# Patient Record
Sex: Male | Born: 1955 | Race: White | Hispanic: No | Marital: Married | Smoking: Never smoker
Health system: Southern US, Community
[De-identification: ages and names within clinical notes are randomized; demographics above are authoritative.]

---

## 2011-06-22 ENCOUNTER — Inpatient Hospital Stay: Payer: Self-pay | Admitting: *Deleted

## 2012-12-19 IMAGING — CT CT HEAD WITHOUT CONTRAST
2 series · 16 of 30 positions shown, 20 images · non-contrast
Comparison: none

REASON FOR EXAM: syncope
COMMENTS:

[Series 2: without · axial · non-contrast · 0.46mm/px · z∈[+328,+453]mm · 13 of 31 slices shown, 17 images]
[im 3/31  brain]
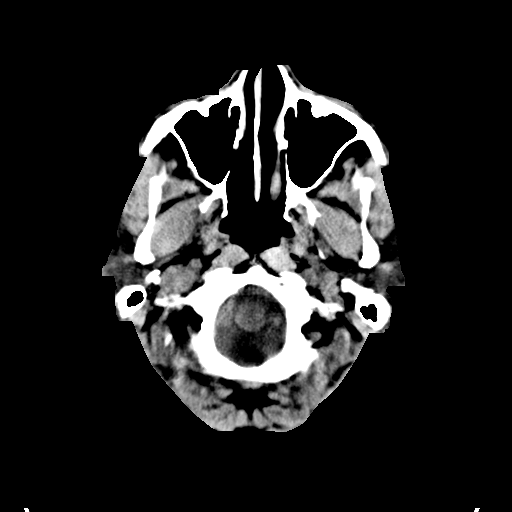
[im 3/31  bone]
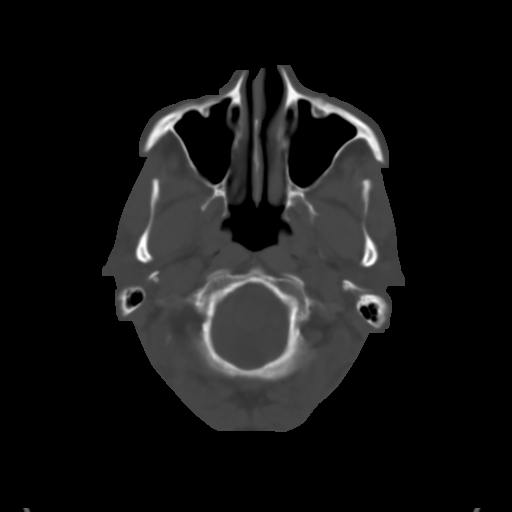
[im 5/31  brain]
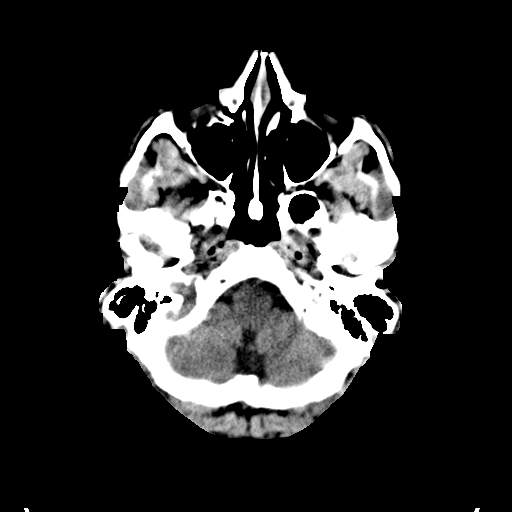
[im 7/31  brain]
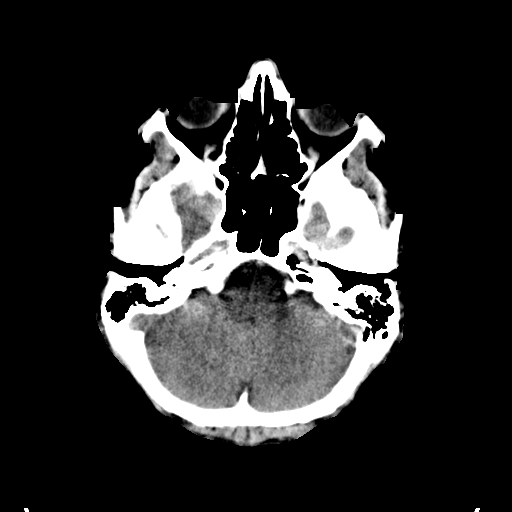
[im 9/31  brain]
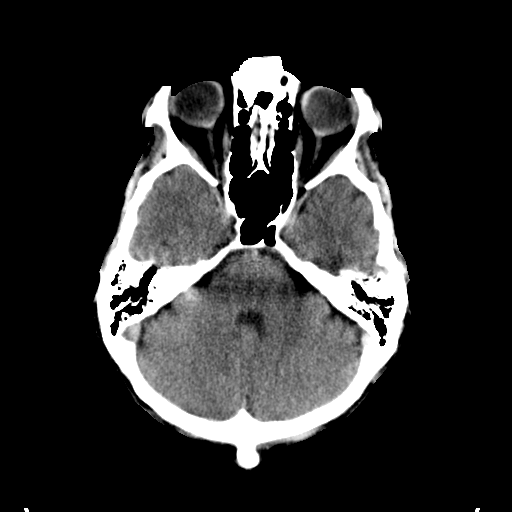
[im 11/31  brain]
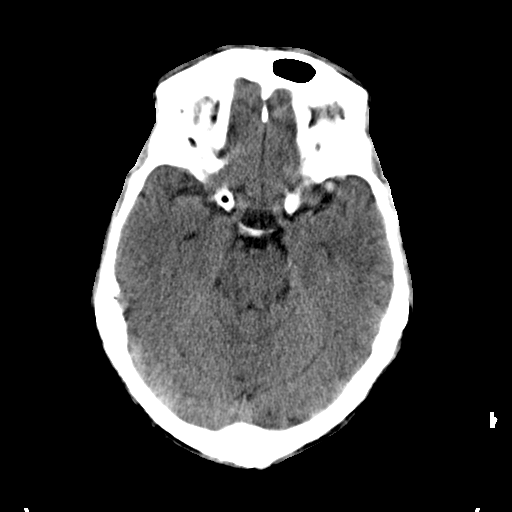
[im 11/31  bone]
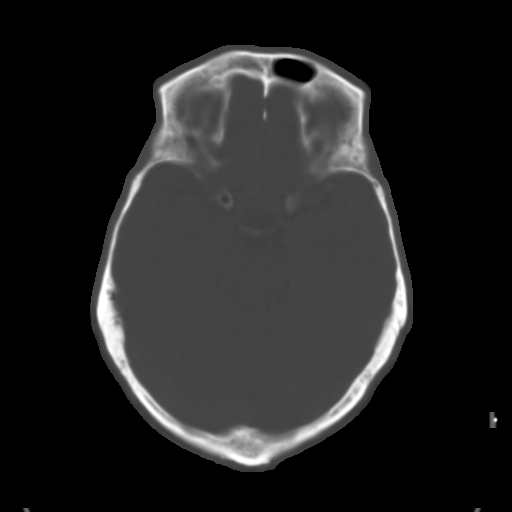
[im 13/31  brain]
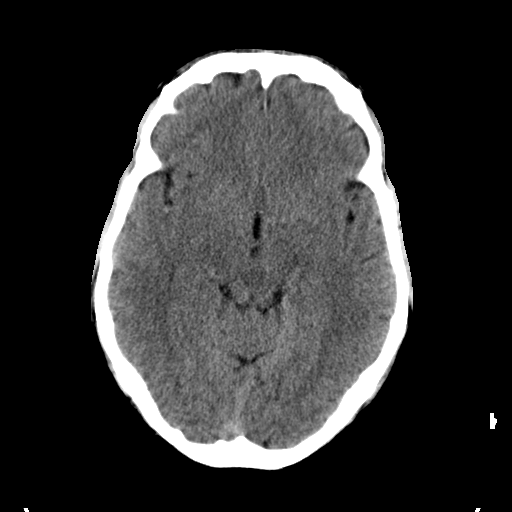
[im 16/31  brain]
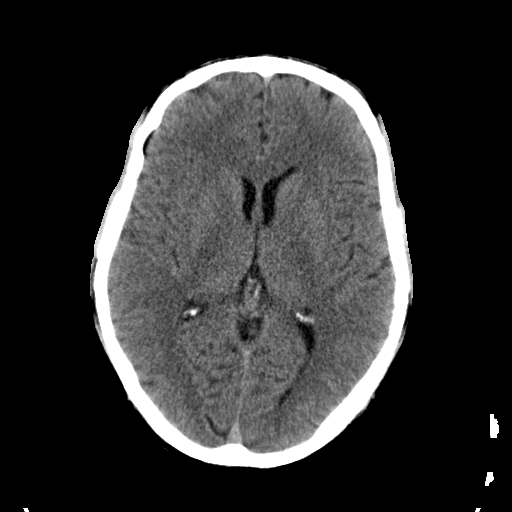
[im 18/31  brain]
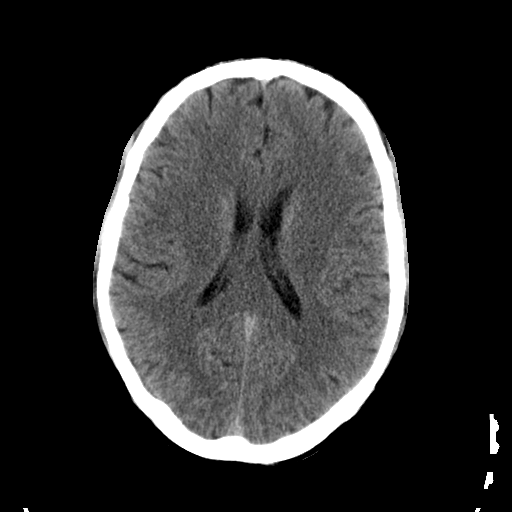
[im 20/31  brain]
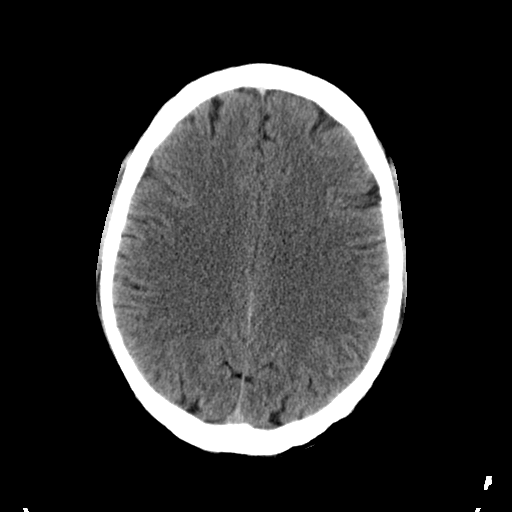
[im 20/31  bone]
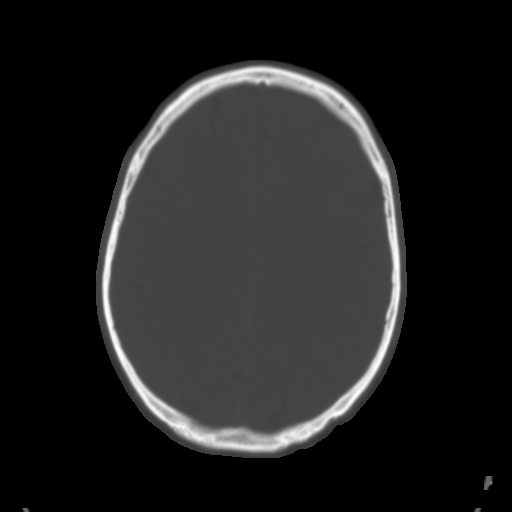
[im 22/31  brain]
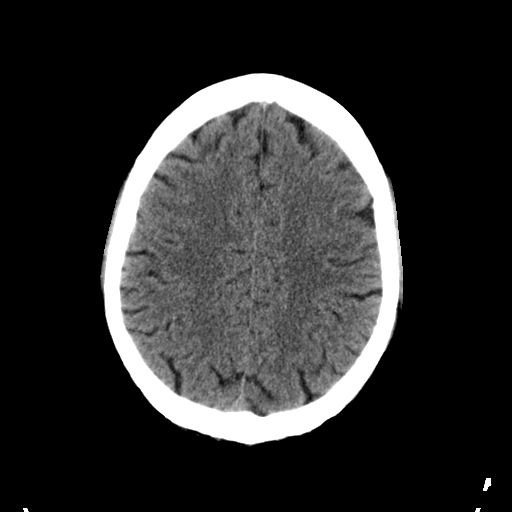
[im 24/31  brain]
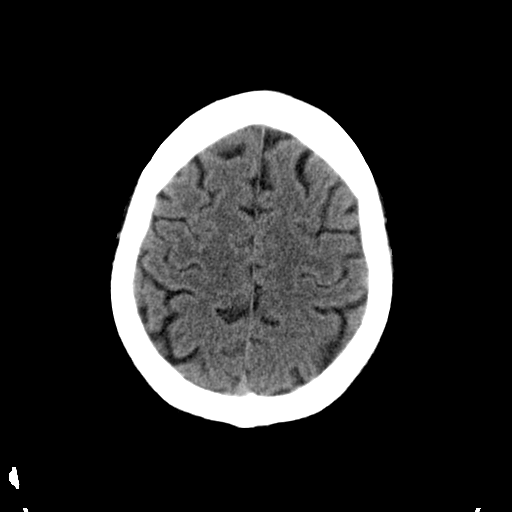
[im 26/31  brain]
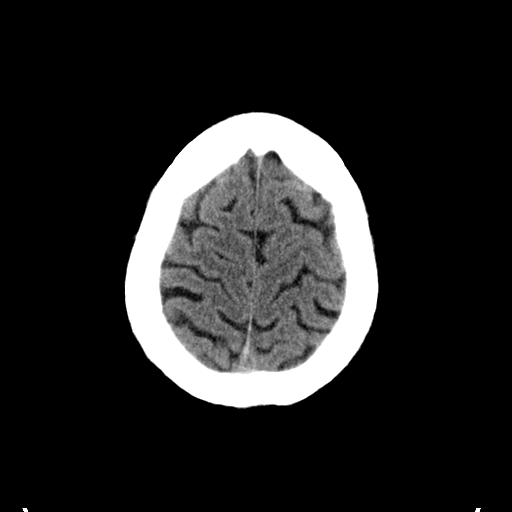
[im 28/31  brain]
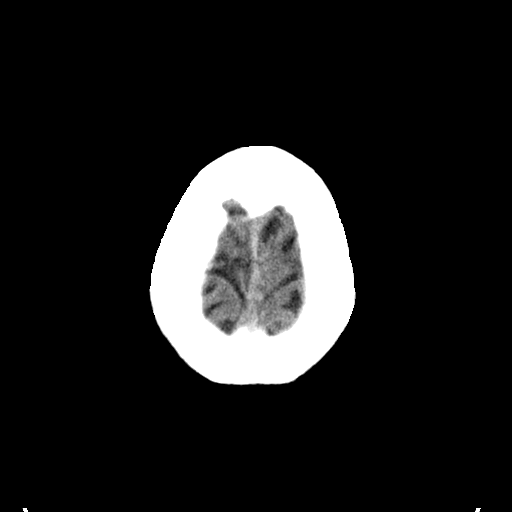
[im 28/31  bone]
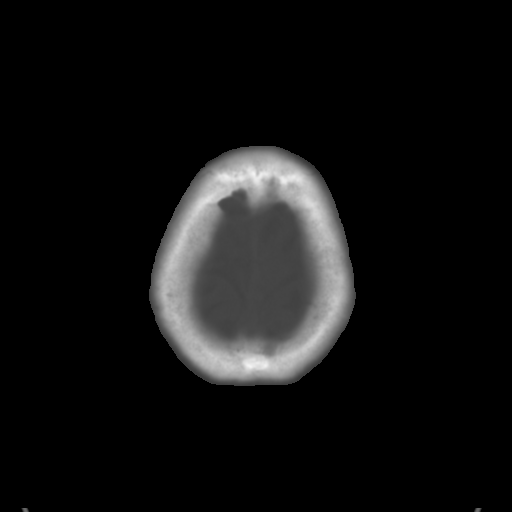

[Series 3: bone · axial · 0.46mm/px · z∈[+328,+368]mm · 3 of 31 slices shown]
[im 3/31  bone]
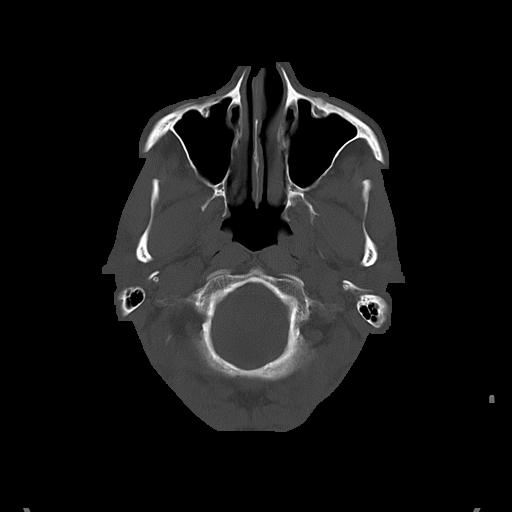
[im 7/31  bone]
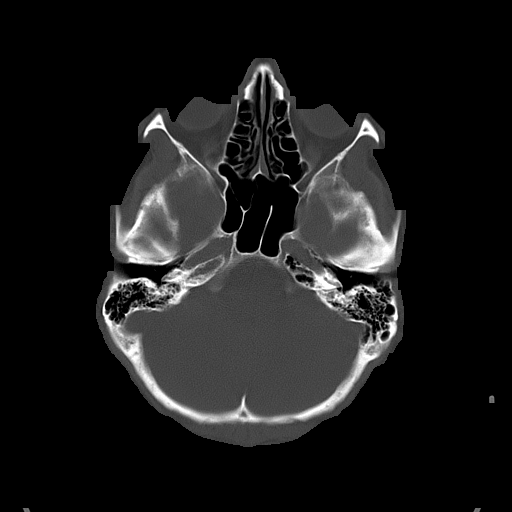
[im 11/31  bone]
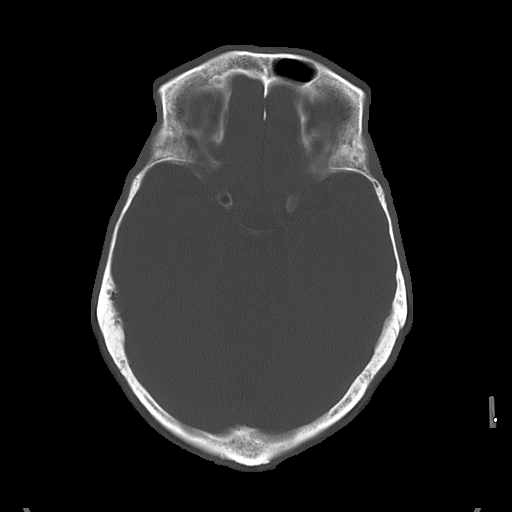

[16 of 30 positions shown; findings below may reference images not displayed]

PROCEDURE:     CT  - CT HEAD WITHOUT CONTRAST  - June 21, 2011 [DATE]

RESULT:     Emergent noncontrast CT of the brain is performed. The patient
has no previous exam for comparison.

The ventricles and sulci are normal. There is no hemorrhage. There is no
focal mass, mass-effect or midline shift. There is no evidence of edema or
territorial infarct. The bone windows demonstrate normal aeration of the
paranasal sinuses and mastoid air cells. There is no skull fracture
demonstrated.
IMPRESSION: 1. No acute intracranial abnormality.

## 2017-09-30 ENCOUNTER — Emergency Department
Admission: EM | Admit: 2017-09-30 | Discharge: 2017-09-30 | Disposition: A | Discharge status: Home / Self Care | Payer: Self-pay | Attending: Emergency Medicine | Admitting: Emergency Medicine

## 2017-09-30 ENCOUNTER — Emergency Department: Payer: Self-pay

## 2017-09-30 ENCOUNTER — Other Ambulatory Visit: Payer: Self-pay

## 2017-09-30 DIAGNOSIS — Y929 Unspecified place or not applicable: Secondary | ICD-10-CM | POA: Insufficient documentation

## 2017-09-30 DIAGNOSIS — W274XXA Contact with kitchen utensil, initial encounter: Secondary | ICD-10-CM | POA: Insufficient documentation

## 2017-09-30 DIAGNOSIS — Z23 Encounter for immunization: Secondary | ICD-10-CM | POA: Insufficient documentation

## 2017-09-30 DIAGNOSIS — Y9389 Activity, other specified: Secondary | ICD-10-CM | POA: Insufficient documentation

## 2017-09-30 DIAGNOSIS — Y998 Other external cause status: Secondary | ICD-10-CM | POA: Insufficient documentation

## 2017-09-30 DIAGNOSIS — S61412A Laceration without foreign body of left hand, initial encounter: Secondary | ICD-10-CM | POA: Insufficient documentation

## 2017-09-30 MED ORDER — OXYCODONE-ACETAMINOPHEN 5-325 MG PO TABS
1.0000 | ORAL_TABLET | Freq: Once | ORAL | Status: AC
  Administered 2017-09-30: 1 via ORAL
  Filled 2017-09-30: qty 1

## 2017-09-30 MED ORDER — SULFAMETHOXAZOLE-TRIMETHOPRIM 800-160 MG PO TABS: 1.0000 | ORAL_TABLET | Freq: Two times a day (BID) | ORAL | 0 refills | Status: AC

## 2017-09-30 MED ORDER — LIDOCAINE HCL (PF) 1 % IJ SOLN
5.0000 mL | Freq: Once | INTRAMUSCULAR | Status: AC
  Administered 2017-09-30: 5 mL
  Filled 2017-09-30: qty 5

## 2017-09-30 MED ORDER — ONDANSETRON 4 MG PO TBDP
ORAL_TABLET | ORAL | Status: AC
  Filled 2017-09-30: qty 1

## 2017-09-30 MED ORDER — LIDOCAINE HCL 1 % IJ SOLN
5.0000 mL | Freq: Once | INTRAMUSCULAR | Status: AC
  Administered 2017-09-30: 5 mL

## 2017-09-30 MED ORDER — ONDANSETRON 4 MG PO TBDP
4.0000 mg | ORAL_TABLET | Freq: Once | ORAL | Status: AC
  Administered 2017-09-30: 4 mg via ORAL

## 2017-09-30 MED ORDER — TETANUS-DIPHTH-ACELL PERTUSSIS 5-2.5-18.5 LF-MCG/0.5 IM SUSP
0.5000 mL | Freq: Once | INTRAMUSCULAR | Status: AC
  Administered 2017-09-30: 0.5 mL via INTRAMUSCULAR
  Filled 2017-09-30: qty 0.5

## 2017-09-30 MED ORDER — LIDOCAINE HCL (PF) 1 % IJ SOLN
INTRAMUSCULAR | Status: AC
  Filled 2017-09-30: qty 10

## 2017-09-30 NOTE — ED Provider Notes (Signed)
[facility] Emergency Department Provider Note  ____________________________________________  Time seen: Approximately 7:12 PM  I have reviewed the triage vital signs and the nursing notes.   HISTORY  Chief Complaint Laceration   HPI Alex Campbell is a 62 y.o. male presents to the emergency department with a 4 cm left dorsal hand laceration after Alex Campbell caught his hand in a meat grinder.  Alex Campbell denies weakness, radiculopathy or changes in sensation of the upper extremities.  Alex Campbell's tetanus status is not updated.   No past medical history on file.  There are no active problems to display for this Alex Campbell.     Prior to Admission medications   Medication Sig Start Date End Date Taking? Authorizing Provider  sulfamethoxazole-trimethoprim (BACTRIM DS,SEPTRA DS) 800-160 MG tablet Take 1 tablet by mouth 2 (two) times daily for 7 days. 09/30/17 10/07/17  Orvil FeilWoods, Eliah Marquard M, PA-C    Allergies Bee venom  No family history on file.  Social History Social History   Tobacco Use  . Smoking status: Not on file  Substance Use Topics  . Alcohol use: Not on file  . Drug use: Not on file     Review of Systems  Constitutional: No fever/chills Eyes: No visual changes. No discharge ENT: No upper respiratory complaints. Cardiovascular: no chest pain. Respiratory: no cough. No SOB. Gastrointestinal: No abdominal pain.  No nausea, no vomiting.  No diarrhea.  No constipation. Musculoskeletal: Alex Campbell has left hand pain.  Skin: Alex Campbell has left hand laceration.  Neurological: Negative for headaches, focal weakness or numbness.   ____________________________________________   PHYSICAL EXAM:  VITAL SIGNS: ED Triage Vitals  Enc Vitals Group     BP 09/30/17 1626 (!) 161/99     Pulse Rate 09/30/17 1626 77     Resp 09/30/17 1626 (!) 24     Temp 09/30/17 1626 (!) 97.5 F (36.4 C)     Temp Source 09/30/17 1626 Oral     SpO2 09/30/17 1626 99 %     Weight  09/30/17 1627 138 lb (62.6 kg)     Height 09/30/17 1627 5\' 11"  (1.803 m)     Head Circumference --      Peak Flow --      Pain Score 09/30/17 1627 10     Pain Loc --      Pain Edu? --      Excl. in [place]? --      Constitutional: Alert and oriented. Well appearing and in no acute distress. Eyes: Conjunctivae are normal. PERRL. EOMI. Head: Atraumatic. Cardiovascular: Normal rate, regular rhythm. Normal S1 and S2.  Good peripheral circulation. Respiratory: Normal respiratory effort without tachypnea or retractions. Lungs CTAB. Good air entry to the bases with no decreased or absent breath sounds. Musculoskeletal: Full range of motion to all extremities. No gross deformities appreciated. Neurologic:  Normal speech and language. No gross focal neurologic deficits are appreciated.  Skin: Alex Campbell has 4 cm left hand laceration  ____________________________________________   LABS (all labs ordered are listed, but only abnormal results are displayed)  Labs Reviewed - No data to display ____________________________________________  EKG   ____________________________________________  RADIOLOGY Geraldo PitterI, Tecumseh Yeagley M Arisbeth Purrington, personally viewed and evaluated these images (plain radiographs) as part of my medical decision making, as well as reviewing the written report by the radiologist.  Dg Hand Complete Left  Result Date: 09/30/2017 CLINICAL DATA:  Left hand was caught in a grinder today. Laceration along the dorsum of the hand. EXAM: LEFT HAND - COMPLETE  3+ VIEW COMPARISON:  None. FINDINGS: Radiopaque foreign bodies are seen along the dorsum of the hand at the level of the distal metacarpal shafts, seen between the second and third metacarpals on the PA view. These number at least 6-7 in total on the lateral view. The largest measures approximately 3 mm. No acute fracture joint dislocations. 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 Fragmented well corticated appearance of the pisiform is  likely developmental in etiology. IMPRESSION: Soft tissue debris along the dorsum of the hand between the second and third metacarpals without underlying osseous involvement. The largest measures approximately 3 mm. 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 Electronically Signed   By: Tollie Eth M.D.   On: 09/30/2017 17:38    ____________________________________________    PROCEDURES  Procedure(s) performed:    Procedures  LACERATION REPAIR Performed by: Orvil Feil Authorized by: Orvil Feil Consent: Verbal consent obtained. Risks and benefits: risks, benefits and alternatives were discussed Consent given by: Alex Campbell Alex Campbell identity confirmed: provided demographic data Prepped and Draped in normal sterile fashion Wound explored and irrigated with 500 cc normal saline.   Laceration Location: Left hand  Laceration Length: 4 cm  No Foreign Bodies seen or palpated  Anesthesia: local infiltration  Local anesthetic: lidocaine 1% without epinephrine  Anesthetic total: 3 ml  Irrigation method: syringe Amount of cleaning: standard  Skin closure: 4-0 Ethilon   Number of sutures: 11  Technique: Simple Interrupted   Alex Campbell tolerance: Alex Campbell tolerated the procedure well with no immediate complications.   Medications  lidocaine (PF) (XYLOCAINE) 1 % injection (not administered)  ondansetron (ZOFRAN-ODT) 4 MG disintegrating tablet (not administered)  Tdap (BOOSTRIX) injection 0.5 mL (not administered)  oxyCODONE-acetaminophen (PERCOCET/ROXICET) 5-325 MG per tablet 1 tablet (1 tablet Oral Given 09/30/17 1758)  lidocaine (PF) (XYLOCAINE) 1 % injection 5 mL (5 mLs Infiltration Given 09/30/17 1758)  lidocaine (XYLOCAINE) 1 % (with pres) injection 5 mL (5 mLs Infiltration Given 09/30/17 1821)  lidocaine (XYLOCAINE) 1 % (with pres) injection 5 mL (5 mLs Infiltration Given 09/30/17 1821)  ondansetron (ZOFRAN-ODT) disintegrating tablet 4 mg (4 mg Oral Given 09/30/17  1821)     ____________________________________________   INITIAL IMPRESSION / ASSESSMENT AND PLAN / ED COURSE  Pertinent labs & imaging results that were available during my care of the Alex Campbell were reviewed by me and considered in my medical decision making (see chart for details).  Review of the [place] CSRS was performed in accordance of the [facility] prior to dispensing any controlled drugs.     Assessment and Plan: Left Hand Laceration: Alex Campbell presents to the emergency department with a left dorsal hand laceration.  Differential diagnosis included laceration, extensor tendon injury, and fracture.  No fractures were identified on x-ray examination of the left hand.  Alex Campbell's tetanus status was updated in the emergency department and he was discharged with Bactrim.  Alex Campbell was advised to have sutures removed by primary care in 9 days.  All Alex Campbell questions were answered.     ____________________________________________  FINAL CLINICAL IMPRESSION(S) / ED DIAGNOSES  Final diagnoses:  Laceration of left hand without foreign body, initial encounter      NEW MEDICATIONS STARTED DURING THIS VISIT:  ED Discharge Orders        Ordered    sulfamethoxazole-trimethoprim (BACTRIM DS,SEPTRA DS) 800-160 MG tablet  2 times daily     09/30/17 1909          This chart was dictated using voice recognition software/Dragon. Despite  best efforts to proofread, errors can occur which can change the meaning. Any change was purely unintentional.    Orvil Feil, PA-C 09/30/17 Guerry Bruin, MD 09/30/17 2330

## 2017-09-30 NOTE — ED Triage Notes (Signed)
Pt states he cut his left hand with a grinder, bleeding controlled, clean saline bandages applied to wound..Marland Kitchen

## 2017-09-30 NOTE — ED Notes (Signed)
Patient transported to X-ray 

## 2019-03-31 IMAGING — CR DG HAND COMPLETE 3+V*L*
3 series · 3 of 3 positions shown · non-contrast
Comparison: None.

CLINICAL DATA: Left hand was caught in a grinder today. Laceration
along the dorsum of the hand.

EXAM:
LEFT HAND - COMPLETE 3+ VIEW

[hand ap]
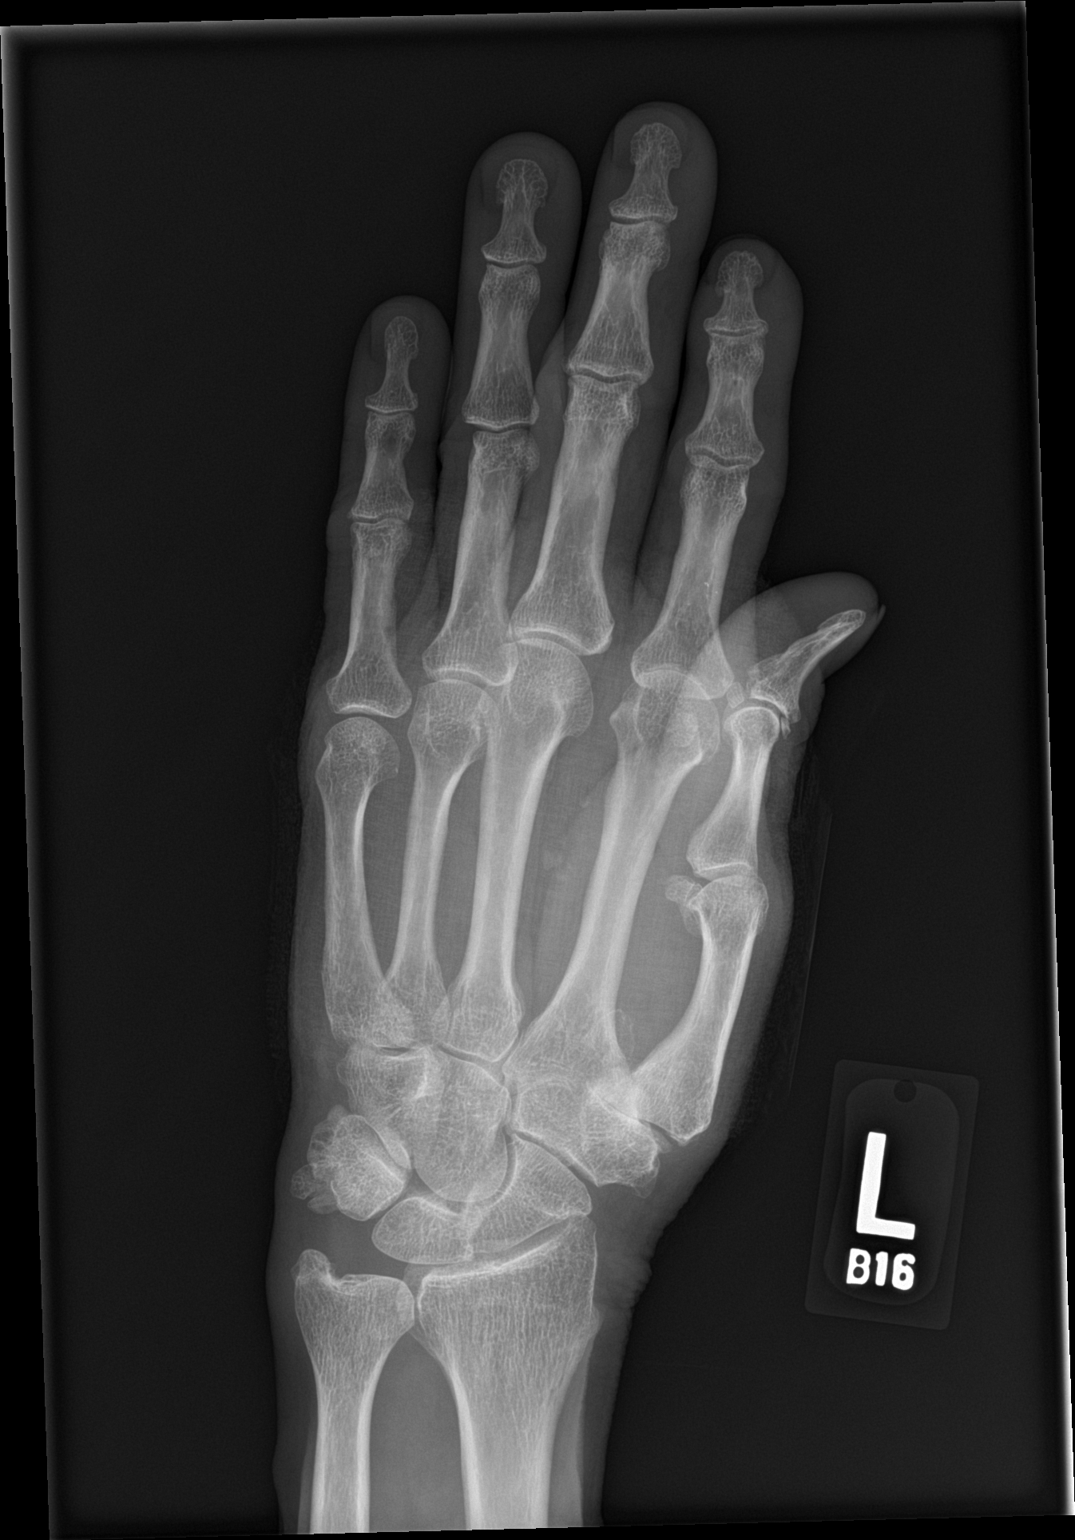

[hand obl]
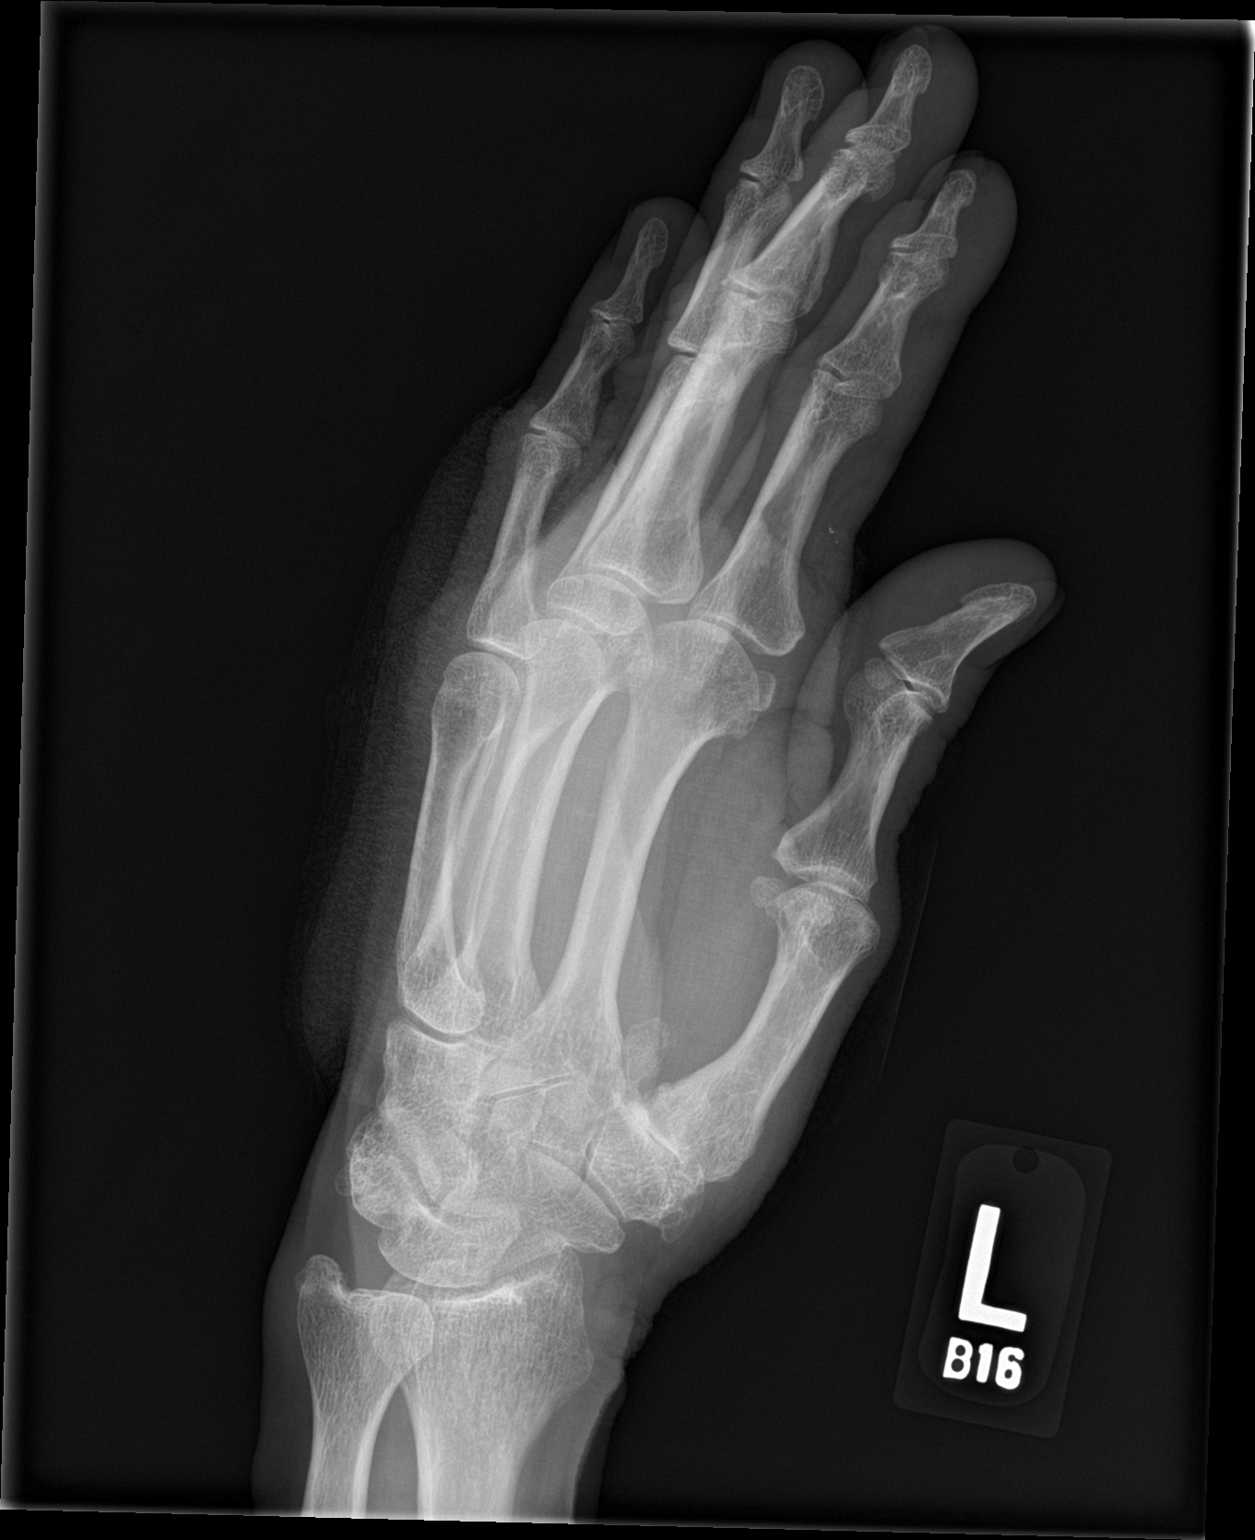

[hand lat]
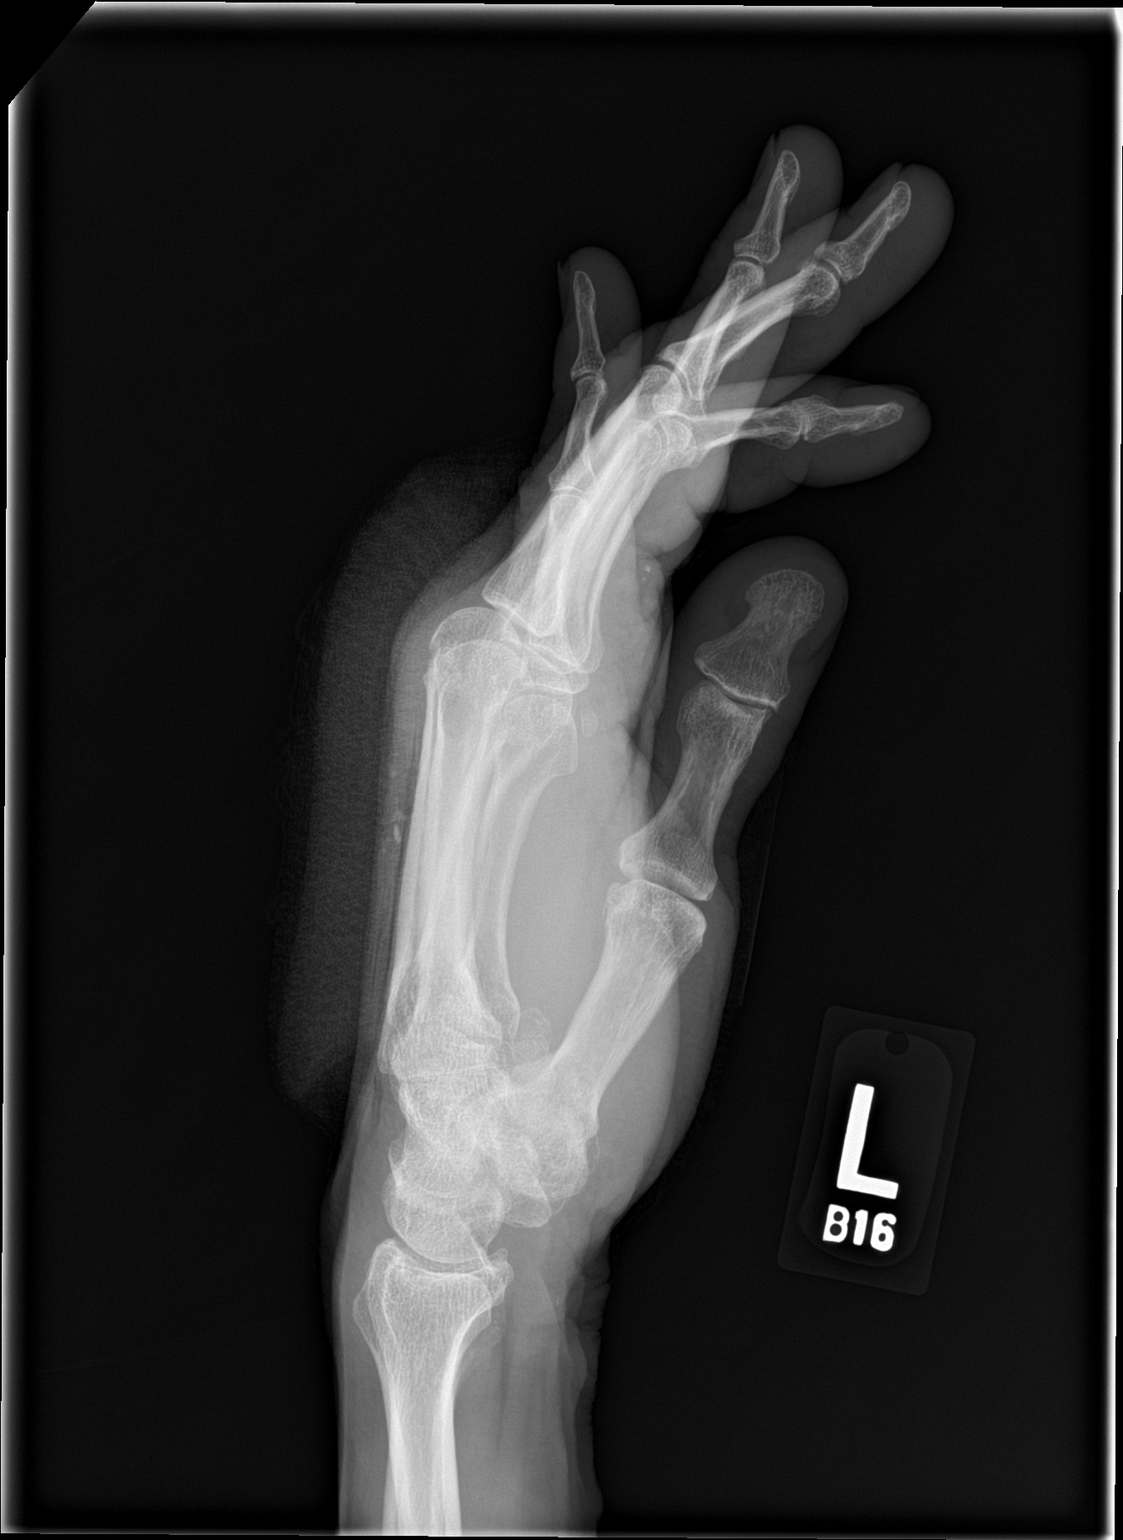

[3 of 3 positions shown; findings below may reference images not displayed]

FINDINGS: Radiopaque foreign bodies are seen along the dorsum of the hand at
the level of the distal metacarpal shafts, seen between the second
and third metacarpals on the PA view. These number at least 6-7 in
total on the lateral view. The largest measures approximately 3 mm.
No acute fracture joint dislocations. Osteoarthritis of the first
[facility] and triscaphe joints. Fragmented well corticated appearance of
the pisiform is likely developmental in etiology.
IMPRESSION: Soft tissue debris along the dorsum of the hand between the second
and third metacarpals without underlying osseous involvement. The
largest measures approximately 3 mm.

Osteoarthritis of the first [facility] and triscaphe joints.

## 2022-03-17 ENCOUNTER — Emergency Department: Payer: Medicare Other

## 2022-03-17 ENCOUNTER — Emergency Department
Admission: EM | Admit: 2022-03-17 | Discharge: 2022-03-17 | Disposition: A | Discharge status: Home / Self Care | Payer: Medicare Other | Attending: Emergency Medicine | Admitting: Emergency Medicine

## 2022-03-17 ENCOUNTER — Other Ambulatory Visit: Payer: Self-pay

## 2022-03-17 DIAGNOSIS — K760 Fatty (change of) liver, not elsewhere classified: Secondary | ICD-10-CM | POA: Insufficient documentation

## 2022-03-17 DIAGNOSIS — R197 Diarrhea, unspecified: Secondary | ICD-10-CM | POA: Diagnosis not present

## 2022-03-17 DIAGNOSIS — R079 Chest pain, unspecified: Secondary | ICD-10-CM

## 2022-03-17 DIAGNOSIS — R072 Precordial pain: Secondary | ICD-10-CM | POA: Diagnosis present

## 2022-03-17 DIAGNOSIS — I10 Essential (primary) hypertension: Secondary | ICD-10-CM | POA: Diagnosis not present

## 2022-03-17 LAB — COMPREHENSIVE METABOLIC PANEL
ALT: 15 U/L (ref 0–44)
AST: 24 U/L (ref 15–41)
Albumin: 4.2 g/dL (ref 3.5–5.0)
Alkaline Phosphatase: 55 U/L (ref 38–126)
Anion gap: 9 (ref 5–15)
BUN: 11 mg/dL (ref 8–23)
CO2: 25 mmol/L (ref 22–32)
Calcium: 9.1 mg/dL (ref 8.9–10.3)
Chloride: 104 mmol/L (ref 98–111)
Creatinine, Ser: 0.92 mg/dL (ref 0.61–1.24)
GFR, Estimated: >60 mL/min (ref >60)
Glucose, Bld: 105 mg/dL — ABNORMAL HIGH (ref 70–99)
Potassium: 3.9 mmol/L (ref 3.5–5.1)
Sodium: 138 mmol/L (ref 135–145)
Total Bilirubin: 1.7 mg/dL — ABNORMAL HIGH (ref 0.3–1.2)
Total Protein: 7.6 g/dL (ref 6.5–8.1)

## 2022-03-17 LAB — CBC
HCT: 44.6 % (ref 39.0–52.0)
Hemoglobin: 15.1 g/dL (ref 13.0–17.0)
MCH: 31.3 pg (ref 26.0–34.0)
MCHC: 33.9 g/dL (ref 30.0–36.0)
MCV: 92.3 fL (ref 80.0–100.0)
Platelets: 372 10*3/uL (ref 150–400)
RBC: 4.83 MIL/uL (ref 4.22–5.81)
RDW: 13 % (ref 11.5–15.5)
WBC: 10.2 10*3/uL (ref 4.0–10.5)
nRBC: 0 % (ref 0.0–0.2)

## 2022-03-17 LAB — PROTIME-INR
INR: 1 (ref 0.8–1.2)
Prothrombin Time: 12.6 seconds (ref 11.4–15.2)

## 2022-03-17 LAB — TROPONIN I (HIGH SENSITIVITY)
Troponin I (High Sensitivity): 4 ng/L (ref <18)
Troponin I (High Sensitivity): 4 ng/L (ref <18)

## 2022-03-17 LAB — LIPASE, BLOOD: Lipase: 39 U/L (ref 11–51)

## 2022-03-17 MED ORDER — ALUM & MAG HYDROXIDE-SIMETH 200-200-20 MG/5ML PO SUSP
30.0000 mL | Freq: Once | ORAL | Status: AC
  Administered 2022-03-17: 30 mL via ORAL
  Filled 2022-03-17: qty 30

## 2022-03-17 MED ORDER — IOHEXOL 350 MG/ML SOLN
100.0000 mL | Freq: Once | INTRAVENOUS | Status: AC | PRN
  Administered 2022-03-17: 100 mL via INTRAVENOUS

## 2022-03-17 MED ORDER — PANTOPRAZOLE SODIUM 40 MG PO TBEC
40.0000 mg | DELAYED_RELEASE_TABLET | Freq: Once | ORAL | Status: AC
  Administered 2022-03-17: 40 mg via ORAL
  Filled 2022-03-17: qty 1

## 2022-03-17 MED ORDER — SUCRALFATE 1 G PO TABS
1.0000 g | ORAL_TABLET | Freq: Once | ORAL | Status: AC
  Administered 2022-03-17: 1 g via ORAL
  Filled 2022-03-17: qty 1

## 2022-03-17 NOTE — Discharge Instructions (Addendum)
Your CT today showed: IMPRESSION: There is no evidence of dissection in the thoracic and abdominal aorta. Major branches of thoracic and abdominal aorta are patent. There is no evidence of mediastinal or retroperitoneal hematoma.   No focal abnormalities are seen in the lung fields.   There is no evidence of intestinal obstruction or pneumoperitoneum. There is no hydronephrosis.   Fatty liver. Degenerative changes are noted in lower thoracic spine and lumbar spine, more so at L4-L5 level with moderate spinal stenosis.

## 2022-03-17 NOTE — ED Triage Notes (Signed)
Pt states he was not feeling well last night and had some diarrhea with nausea, states this morning while waiting in line at [employer] he had sharp nonradiating substernal chest pain last night for about a min. States he still has the pain but it is not as intense.

## 2022-03-17 NOTE — ED Notes (Signed)
Patient to xray at this time

## 2022-03-17 NOTE — ED Provider Notes (Signed)
[facility] Provider Note    Event Date/Time   First MD Initiated Contact with Patient 03/17/22 0800     (approximate)   History   Chest Pain   HPI  Alex Campbell is a 66 y.o. male with no significant past medical history with patient stating "I am been to a doctor's intoxicated" who presents for evaluation of substernal chest pain.  Patient states that he had tacos yesterday afternoon for lunch and that last night he had some nonbloody diarrhea and nausea and a little bit of substernal chest discomfort that resolved after a few minutes.  He states that he was feeling better until he went to be scheduled this morning in the waiting in line had about a minute of some sharp substernal chest pain that is since eased off and he only has very slight discomfort now.  He denies any associated cough, shortness of breath, fevers, vomiting, blood in his stool, melanotic stools, abdominal pain, urinary symptoms, back pain rash or extremity pain.  No recent earache, sore throat or rash.  He denies tobacco use or illicit drug use and states he drinks about 3 beers per day.  He does state that he will sometimes take an aspirin for intermittent headaches but has not taken anything today.  He has no other acute concerns at this time.  States he has never been diagnosed with high blood pressure.    History reviewed. No pertinent past medical history.   Physical Exam  Triage Vital Signs: ED Triage Vitals [03/17/22 0754]  Enc Vitals Group     BP      Pulse      Resp      Temp      Temp src      SpO2      Weight 135 lb (61.2 kg)     Height 5' 11"  (1.803 m)     Head Circumference      Peak Flow      Pain Score 3     Pain Loc      Pain Edu?      Excl. in [place]?     Most recent vital signs: Vitals:   03/17/22 0802 03/17/22 1030  BP: (!) 183/109 (!) 169/105  Pulse: 65 (!) 56  Resp: 18 15  Temp: (!) 97.5 F (36.4 C)   SpO2: 99% 96%    General: Awake, no distress.   CV:  Good peripheral perfusion.  No significant murmur. Resp:  Normal effort.  Clear bilaterally. Abd:  No distention.  Soft. Other:     ED Results / Procedures / Treatments  Labs (all labs ordered are listed, but only abnormal results are displayed) Labs Reviewed  COMPREHENSIVE METABOLIC PANEL - Abnormal; Notable for the following components:      Result Value   Glucose, Bld 105 (*)    Total Bilirubin 1.7 (*)    All other components within normal limits  CBC  LIPASE, BLOOD  PROTIME-INR  TROPONIN I (HIGH SENSITIVITY)  TROPONIN I (HIGH SENSITIVITY)     EKG  EKG is remarkable for sinus rhythm with ventricular rate of 61, some nonspecific ST change in inferior leads and lateral leads as well as nonspecific change in V3.  PACs present.  QTc is 450.   RADIOLOGY Chest reviewed by myself shows no focal consoidation, effusion, edema, pneumothorax or other clear acute thoracic process. I also reviewed radiology interpretation and agree with findings described including some age-indeterminate and apparent  thoracic compression fractures.  These are favored to be chronic.Marland Kitchen  CTA chest abdomen pelvis on my interpretation no evidence of dissection, large PE, pericardial effusion, cholecystitis, diverticulitis or other clear acute abdominal thoracic or pelvic process.  I reviewed radiology interpretation and agree their findings in addition to notation of fatty liver and some degenerative changes noted in the spine.  I discussed this with patient.    PROCEDURES:  Critical Care performed: No  .1-3 Lead EKG Interpretation  Performed by: Lucrezia Starch, MD Authorized by: Lucrezia Starch, MD     Interpretation: normal     ECG rate assessment: normal     Rhythm: sinus rhythm     Ectopy: none     Conduction: normal     The patient is on the cardiac monitor to evaluate for evidence of arrhythmia and/or significant heart rate changes.   MEDICATIONS ORDERED IN ED: Medications  alum  & mag hydroxide-simeth (MAALOX/MYLANTA) 200-200-20 MG/5ML suspension 30 mL (30 mLs Oral Given 03/17/22 0823)  sucralfate (CARAFATE) tablet 1 g (1 g Oral Given 03/17/22 0823)  pantoprazole (PROTONIX) EC tablet 40 mg (40 mg Oral Given 03/17/22 0823)  iohexol (OMNIPAQUE) 350 MG/ML injection 100 mL (100 mLs Intravenous Contrast Given 03/17/22 1057)     IMPRESSION / MDM / ASSESSMENT AND PLAN / ED COURSE  I reviewed the triage vital signs and the nursing notes. Patient's presentation is most consistent with acute presentation with potential threat to life or bodily function.                               Differential diagnosis includes, but is not limited to ACS, dissection, gastritis/GERD, pneumothorax, MSK with overall lower suspicion for PE given absence of any tachycardia or hypoxia or associated shortness of breath or clear risk factors.  I am less suspicious for a dissection at this time given overall description of symptoms.  EKG is remarkable for sinus rhythm with ventricular rate of 61, some nonspecific ST change in inferior leads and lateral leads as well as nonspecific change in V3.  PACs present.  QTc is 450.  Given nonelevated troponin x2 Evalose patient for ACS at this time.  Chest reviewed by myself shows no focal consoidation, effusion, edema, pneumothorax or other clear acute thoracic process. I also reviewed radiology interpretation and agree with findings described including some age-indeterminate and apparent thoracic compression fractures.  These are favored to be chronic.Marland Kitchen  CTA chest abdomen pelvis on my interpretation no evidence of dissection, large PE, pericardial effusion, cholecystitis, diverticulitis or other clear acute abdominal thoracic or pelvic process.  I reviewed radiology interpretation and agree their findings in addition to notation of fatty liver and some degenerative changes noted in the spine.  I discussed this with patient.  CMP remarkable for a bilirubin of 1.7  with normal LFTs, alk phos and no other significant electrolyte or metabolic derangements.  Lipase is WNL and not suggestive of acute pancreatitis.  INR is unremarkable.  CBC without leukocytosis or acute anemia.  Overall unclear precise etiology of patient's symptoms although at this point given very reassuring work-up patient stating he is feeling much better I think is stable for discharge with outpatient continued evaluation PCP.  Discussed decreasing his alcohol to 2 beverages per day and that he should decrease carbohydrates and sweets in his diet for his fatty liver.  Discussed returning for any new or worsening of symptoms.  Advised elevated blood  pressure importance of having this rechecked by PCP to see if he needs to be initiated on antihypertensives.  Discharged in stable condition.  Strict and precautions advised and discussed.      FINAL CLINICAL IMPRESSION(S) / ED DIAGNOSES   Final diagnoses:  Hypertension, unspecified type  Chest pain, unspecified type  Fatty liver     Rx / DC Orders   ED Discharge Orders     None        Note:  This document was prepared using Dragon voice recognition software and may include unintentional dictation errors.   Lucrezia Starch, MD 03/17/22 1139
